# Patient Record
Sex: Male | Born: 1995 | Hispanic: Yes | Marital: Married | Smoking: Former smoker
Health system: Southern US, Community
[De-identification: ages and names within clinical notes are randomized; demographics above are authoritative.]

## PROBLEM LIST (undated history)

## (undated) DIAGNOSIS — E291 Testicular hypofunction: Secondary | ICD-10-CM

## (undated) HISTORY — DX: Testicular hypofunction: E29.1

---
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
## 2022-06-16 ENCOUNTER — Ambulatory Visit: Payer: Self-pay | Admitting: *Deleted

## 2022-06-16 NOTE — Telephone Encounter (Signed)
Pt hung up before being connected me me from agent. Reason for Disposition  [1] MODERATE pain (e.g., interferes with normal activities) AND [2] pain comes and goes (cramps) AND [3] present > 24 hours  (Exception: Pain with Vomiting or Diarrhea - see that Guideline.)  Answer Assessment - Initial Assessment Questions 1. LOCATION: "Where does it hurt?"      I'm having abd pain above my belly button.    I think I strained myself at the gym Saturday.    It's painful when I touch it.   I'm wearing a compression belt around my torso. 2. RADIATION: "Does the pain shoot anywhere else?" (e.g., chest, back)     No 3. ONSET: "When did the pain begin?" (Minutes, hours or days ago)      Saturday 4. SUDDEN: "Gradual or sudden onset?"      5. PATTERN "Does the pain come and go, or is it constant?"    - If it comes and goes: "How long does it last?" "Do you have pain now?"     (Note: Comes and goes means the pain is intermittent. It goes away completely between bouts.)    - If constant: "Is it getting better, staying the same, or getting worse?"      (Note: Constant means the pain never goes away completely; most serious pain is constant and gets worse.)      Staying there getting worse 6. SEVERITY: "How bad is the pain?"  (e.g., Scale 1-10; mild, moderate, or severe)    - MILD (1-3): Doesn't interfere with normal activities, abdomen soft and not tender to touch.     - MODERATE (4-7): Interferes with normal activities or awakens from sleep, abdomen tender to touch.     - SEVERE (8-10): Excruciating pain, doubled over, unable to do any normal activities.       Moderate pain getting worse 7. RECURRENT SYMPTOM: "Have you ever had this type of stomach pain before?" If Yes, ask: "When was the last time?" and "What happened that time?"      No 8. CAUSE: "What do you think is causing the stomach pain?"     I strained my self on Sat. At the gym 9. RELIEVING/AGGRAVATING FACTORS: "What makes it better or worse?"  (e.g., antacids, bending or twisting motion, bowel movement)     Have not tried anything 10. OTHER SYMPTOMS: "Do you have any other symptoms?" (e.g., back pain, diarrhea, fever, urination pain, vomiting)       No diarrhea or vomiting.  Protocols used: Abdominal Pain - Male-A-AH

## 2022-06-16 NOTE — Telephone Encounter (Signed)
  Chief Complaint: abd pain above his belly button that started Sat. While in the gym.  Symptoms: It's becoming progressively worse. Frequency: Since Sat. At the gym Pertinent Negatives: Patient denies feeling anything pop or occur while in the gym.    Denies diarrhea or vomiting. Disposition: [] ED /[x] Urgent Care (no appt availability in office) / [] Appointment(In office/virtual)/ []  [facility]/ [] Home Care/ [] Refused Recommended Disposition /[] [facility]/ []  Follow-up with PCP Additional Notes: Pt wants to establish care with [facility] so I warm transferred him to [facility] to be schedule per their policy.   I encouraged him to go to the urgent care in the mean time for evaluation which he was agreeable to doing.

## 2022-07-25 ENCOUNTER — Ambulatory Visit: Payer: Self-pay | Admitting: Internal Medicine

## 2024-08-13 ENCOUNTER — Telehealth: Payer: Self-pay | Admitting: Family Medicine

## 2024-08-13 NOTE — Telephone Encounter (Signed)
 Yes

## 2024-09-20 ENCOUNTER — Ambulatory Visit: Payer: Self-pay | Admitting: Family Medicine

## 2024-09-20 ENCOUNTER — Encounter: Payer: Self-pay | Admitting: Family Medicine

## 2024-09-20 VITALS — BP 126/78 | HR 96 | Temp 98.0°F | Resp 18 | Ht 74.0 in | Wt 306.6 lb

## 2024-09-20 DIAGNOSIS — F341 Dysthymic disorder: Secondary | ICD-10-CM | POA: Diagnosis not present

## 2024-09-20 DIAGNOSIS — R5383 Other fatigue: Secondary | ICD-10-CM | POA: Diagnosis not present

## 2024-09-20 DIAGNOSIS — R198 Other specified symptoms and signs involving the digestive system and abdomen: Secondary | ICD-10-CM | POA: Diagnosis not present

## 2024-09-20 DIAGNOSIS — E291 Testicular hypofunction: Secondary | ICD-10-CM

## 2024-09-20 DIAGNOSIS — Z131 Encounter for screening for diabetes mellitus: Secondary | ICD-10-CM | POA: Diagnosis not present

## 2024-09-20 DIAGNOSIS — E669 Obesity, unspecified: Secondary | ICD-10-CM

## 2024-09-20 DIAGNOSIS — Z113 Encounter for screening for infections with a predominantly sexual mode of transmission: Secondary | ICD-10-CM

## 2024-09-20 DIAGNOSIS — Z1159 Encounter for screening for other viral diseases: Secondary | ICD-10-CM | POA: Diagnosis not present

## 2024-09-20 DIAGNOSIS — Z13220 Encounter for screening for lipoid disorders: Secondary | ICD-10-CM

## 2024-09-20 NOTE — Patient Instructions (Signed)
 Call insurance to find out if they cover weight loss medications and the name of the medications on formulary   I recommend Zepbound or Wegovy  Please keep a journal of life style modifications Cut down on snacks Prep meals at home Avoid sweet beverages Resume regular work outs

## 2024-10-17 ENCOUNTER — Ambulatory Visit: Admitting: Family Medicine

## 2025-01-23 ENCOUNTER — Encounter: Admitting: Family Medicine
